# Patient Record
Sex: Male | Born: 1970 | Race: Black or African American | Hispanic: No | Marital: Single | Smoking: Never smoker
Health system: Southern US, Community
[De-identification: ages and names within clinical notes are randomized; demographics above are authoritative.]

## PROBLEM LIST (undated history)

## (undated) HISTORY — PX: BACK SURGERY: SHX140

## (undated) HISTORY — PX: HERNIA REPAIR: SHX51

## (undated) HISTORY — PX: KNEE SURGERY: SHX244

---

## 2016-06-14 ENCOUNTER — Emergency Department
Admission: EM | Admit: 2016-06-14 | Discharge: 2016-06-14 | Disposition: A | Discharge status: Home / Self Care | Payer: Self-pay | Attending: Emergency Medicine | Admitting: Emergency Medicine

## 2016-06-14 ENCOUNTER — Emergency Department: Payer: Self-pay

## 2016-06-14 ENCOUNTER — Encounter: Payer: Self-pay | Admitting: *Deleted

## 2016-06-14 DIAGNOSIS — R079 Chest pain, unspecified: Secondary | ICD-10-CM

## 2016-06-14 DIAGNOSIS — J069 Acute upper respiratory infection, unspecified: Secondary | ICD-10-CM | POA: Insufficient documentation

## 2016-06-14 MED ORDER — IBUPROFEN 800 MG PO TABS
800.0000 mg | ORAL_TABLET | Freq: Once | ORAL | Status: AC
  Administered 2016-06-14: 800 mg via ORAL

## 2016-06-14 MED ORDER — IBUPROFEN 800 MG PO TABS
ORAL_TABLET | ORAL | Status: AC
  Filled 2016-06-14: qty 1

## 2016-06-14 MED ORDER — BENZONATATE 100 MG PO CAPS: 100.0000 mg | ORAL_CAPSULE | Freq: Four times a day (QID) | ORAL | 0 refills | Status: AC | PRN

## 2016-06-14 MED ORDER — ALBUTEROL SULFATE HFA 108 (90 BASE) MCG/ACT IN AERS: 2.0000 | INHALATION_SPRAY | Freq: Four times a day (QID) | RESPIRATORY_TRACT | 0 refills | Status: AC | PRN

## 2016-06-14 MED ORDER — ALBUTEROL SULFATE (2.5 MG/3ML) 0.083% IN NEBU
5.0000 mg | INHALATION_SOLUTION | Freq: Once | RESPIRATORY_TRACT | Status: AC
  Administered 2016-06-14: 5 mg via RESPIRATORY_TRACT
  Filled 2016-06-14: qty 6

## 2016-06-14 NOTE — ED Notes (Signed)
Pt c/o chest pain and tightness with shortness of breath over the last 2 days - reports productive cough - runny nose - fever reported (pt never took temp)

## 2016-06-14 NOTE — ED Provider Notes (Signed)
[facility] Emergency Department Provider Note  ____________________________________________  Time seen: Approximately 10:21 PM  I have reviewed the triage vital signs and the nursing notes.   HISTORY  Chief Complaint Cough    HPI Timothy Henderson is a 46 y.o. male , nonsmoker, presenting with cough, congestion and rhinorrhea, sore throat and chest pain. The patient reports that over the past several days he has been having a significant nonproductive cough. Since then, he has developed a mild sore throat and congestion and rhinorrhea without ear pain, shortness of breath, or drooling. No body aches, nausea vomiting or diarrhea. With the coughing, he develops a central chest pain that resolves when he stops coughing.  No pleuritic component, LE swelling or calf pain.   No past medical history on file.  There are no active problems to display for this patient.   Past Surgical History:  Procedure Laterality Date  . BACK SURGERY    . HERNIA REPAIR    . KNEE SURGERY Left       Allergies Penicillins  No family history on file.  Social History Social History  Substance Use Topics  . Smoking status: Never Smoker  . Smokeless tobacco: Never Used  . Alcohol use No    Review of Systems Constitutional: No fever/chills.No lightheadedness or syncope. No myalgias. Eyes: No visual changes. No eye discharge. ENT: Positive mild sore throat. Positive congestion and rhinorrhea. Cardiovascular: Positive chest pain with coughing only. Denies palpitations. Respiratory: Denies shortness of breath.  Positive nonproductive cough. Gastrointestinal: No abdominal pain.  No nausea, no vomiting.  No diarrhea.  No constipation. Genitourinary: Negative for dysuria. Musculoskeletal: Negative for back pain. No lower extremity swelling or calf pain. Skin: Negative for rash. Neurological: Negative for headaches. No focal numbness, tingling or weakness.   10-point ROS  otherwise negative.  ____________________________________________   PHYSICAL EXAM:  VITAL SIGNS: ED Triage Vitals  Enc Vitals Group     BP 06/14/16 2203 122/86     Pulse Rate 06/14/16 2203 96     Resp 06/14/16 2203 18     Temp 06/14/16 2203 98.9 F (37.2 C)     Temp Source 06/14/16 2203 Oral     SpO2 06/14/16 2203 95 %     Weight 06/14/16 2204 235 lb (106.6 kg)     Height 06/14/16 2204 6\' 1"  (1.854 m)     Head Circumference --      Peak Flow --      Pain Score 06/14/16 2204 6     Pain Loc --      Pain Edu? --      Excl. in GC? --     Constitutional: Alert and oriented. Well appearing and in no acute distress. Answers questions appropriately. Eyes: Conjunctivae are normal.  EOMI. No scleral icterus. Head: Atraumatic. Nose: Positive congestion/rhinnorhea. Mouth/Throat: Mucous membranes are moist. No posterior pharyngeal erythema, tonsillar swelling or exudate. Posterior palate is symmetric and the uvula is midline. Neck: No stridor.  Supple.  No JVD. No meningismus. Cardiovascular: Normal rate, regular rhythm. No murmurs, rubs or gallops.  Respiratory: Normal respiratory effort.  No accessory muscle use or retractions. Lungs CTAB.  No wheezes, rales or ronchi. Gastrointestinal: Soft, nontender and nondistended.  No guarding or rebound.  No peritoneal signs. Musculoskeletal: No LE edema. No ttp in the calves or palpable cords.  Negative Homan's sign. Neurologic:  A&Ox3.  Speech is clear.  Face and smile are symmetric.  EOMI.  Moves all extremities well. Skin:  Skin is warm, dry and intact. No rash noted. Psychiatric: Mood and affect are normal. Speech and behavior are normal.  Normal judgement.  ____________________________________________   LABS (all labs ordered are listed, but only abnormal results are displayed)  Labs Reviewed - No data to display ____________________________________________  EKG  ED ECG REPORT I, Rockne MenghiniNorman, Anne-Caroline, the attending physician,  personally viewed and interpreted this ECG.   Date: 06/14/2016  EKG Time: 2201  Rate: 88  Rhythm: normal sinus rhythm  Axis: leftward  Intervals:none  ST&T Change: No STEMI  ____________________________________________  RADIOLOGY  No results found.  ____________________________________________   PROCEDURES  Procedure(s) performed: None  Procedures  Critical Care performed: No ____________________________________________   INITIAL IMPRESSION / ASSESSMENT AND PLAN / ED COURSE  Pertinent labs & imaging results that were available during my care of the patient were reviewed by me and considered in my medical decision making (see chart for details).  46 y.o. male with several days of cough, congestion and rhinorrhea. Overall, the patient is well-appearing and has stable vital signs. His cardiopulmonary examination is also normal. He likely has a viral URI, and chest x-rays pending to evaluate for pneumonia. His chest pain is only related to his coughing and is likely due to strain from persistent cough. I will plan to discharge him home with Tessalon Perles, and albuterol MDI, and close PMD follow-up. Return precautions as well as follow-up instructions were discussed.  ____________________________________________  FINAL CLINICAL IMPRESSION(S) / ED DIAGNOSES  Final diagnoses:  Upper respiratory tract infection, unspecified type  Chest pain, unspecified type         NEW MEDICATIONS STARTED DURING THIS VISIT:  New Prescriptions   ALBUTEROL (PROVENTIL HFA;VENTOLIN HFA) 108 (90 BASE) MCG/ACT INHALER    Inhale 2 puffs into the lungs every 6 (six) hours as needed for wheezing or shortness of breath.   BENZONATATE (TESSALON PERLES) 100 MG CAPSULE    Take 1 capsule (100 mg total) by mouth every 6 (six) hours as needed for cough.      Rockne MenghiniAnne-Caroline Amous Crewe, MD 06/14/16 2238

## 2016-06-14 NOTE — Discharge Instructions (Signed)
Please drink plenty of fluid to stay well-hydrated. You may take albuterol and Tessalon Perles to decrease cough. You may take Tylenol or Motrin for ear pain.  Return to the emergency department if you develop shortness of breath, lightheadedness or fainting, chest pain, fever, or any other symptoms concerning to you.

## 2016-06-14 NOTE — ED Triage Notes (Signed)
Pt c/o cough and flu-like sxs x 2 days. Pt c/o chest pain, worse w/ cough. Pt c/o coughing phlegm w/ streaks of blood. Pt c/o headache and orthopnea. Pt ambulatory to triage, drove self to ED, no observable acute distress at this time.

## 2016-06-14 NOTE — ED Notes (Signed)
Pt in with co cold symptoms for few days.

## 2016-06-15 ENCOUNTER — Telehealth: Payer: Self-pay | Admitting: Emergency Medicine

## 2016-06-15 NOTE — Telephone Encounter (Signed)
Calling requesting a work note , spoke with Dr.Williams , pt return to work 06-16-16, no restrictions.   Work Note Faxed to Attn: ChiropodistAkela Neumaier

## 2017-10-03 IMAGING — CR DG CHEST 2V
1 series · 2 of 2 positions shown · non-contrast
Comparison: None.

CLINICAL DATA: Chest pain

EXAM:
CHEST  2 VIEW

[Series 1: dg chest 2 view · 0.14mm/px · 2 of 2 slices shown]
[im 1/2]
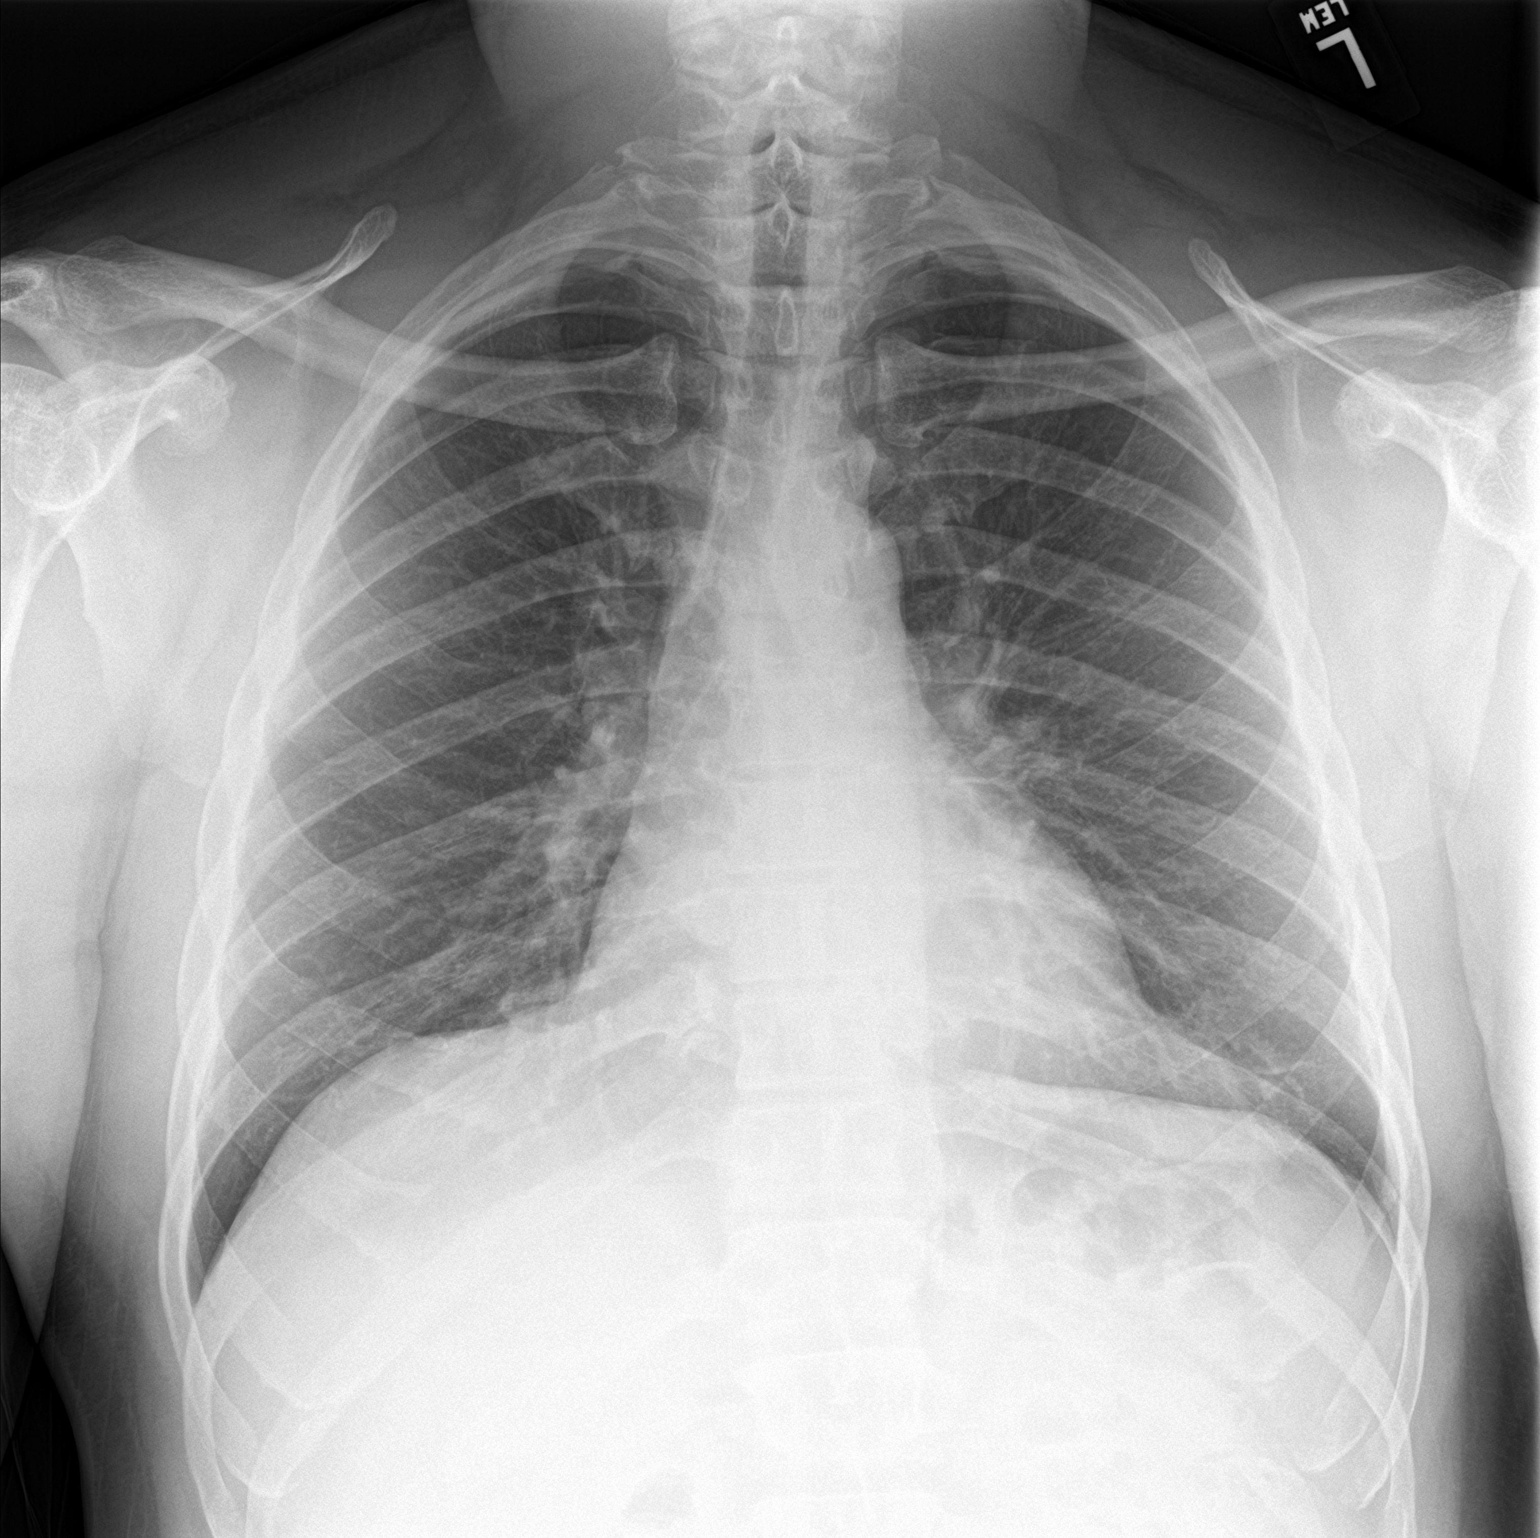
[im 2/2]
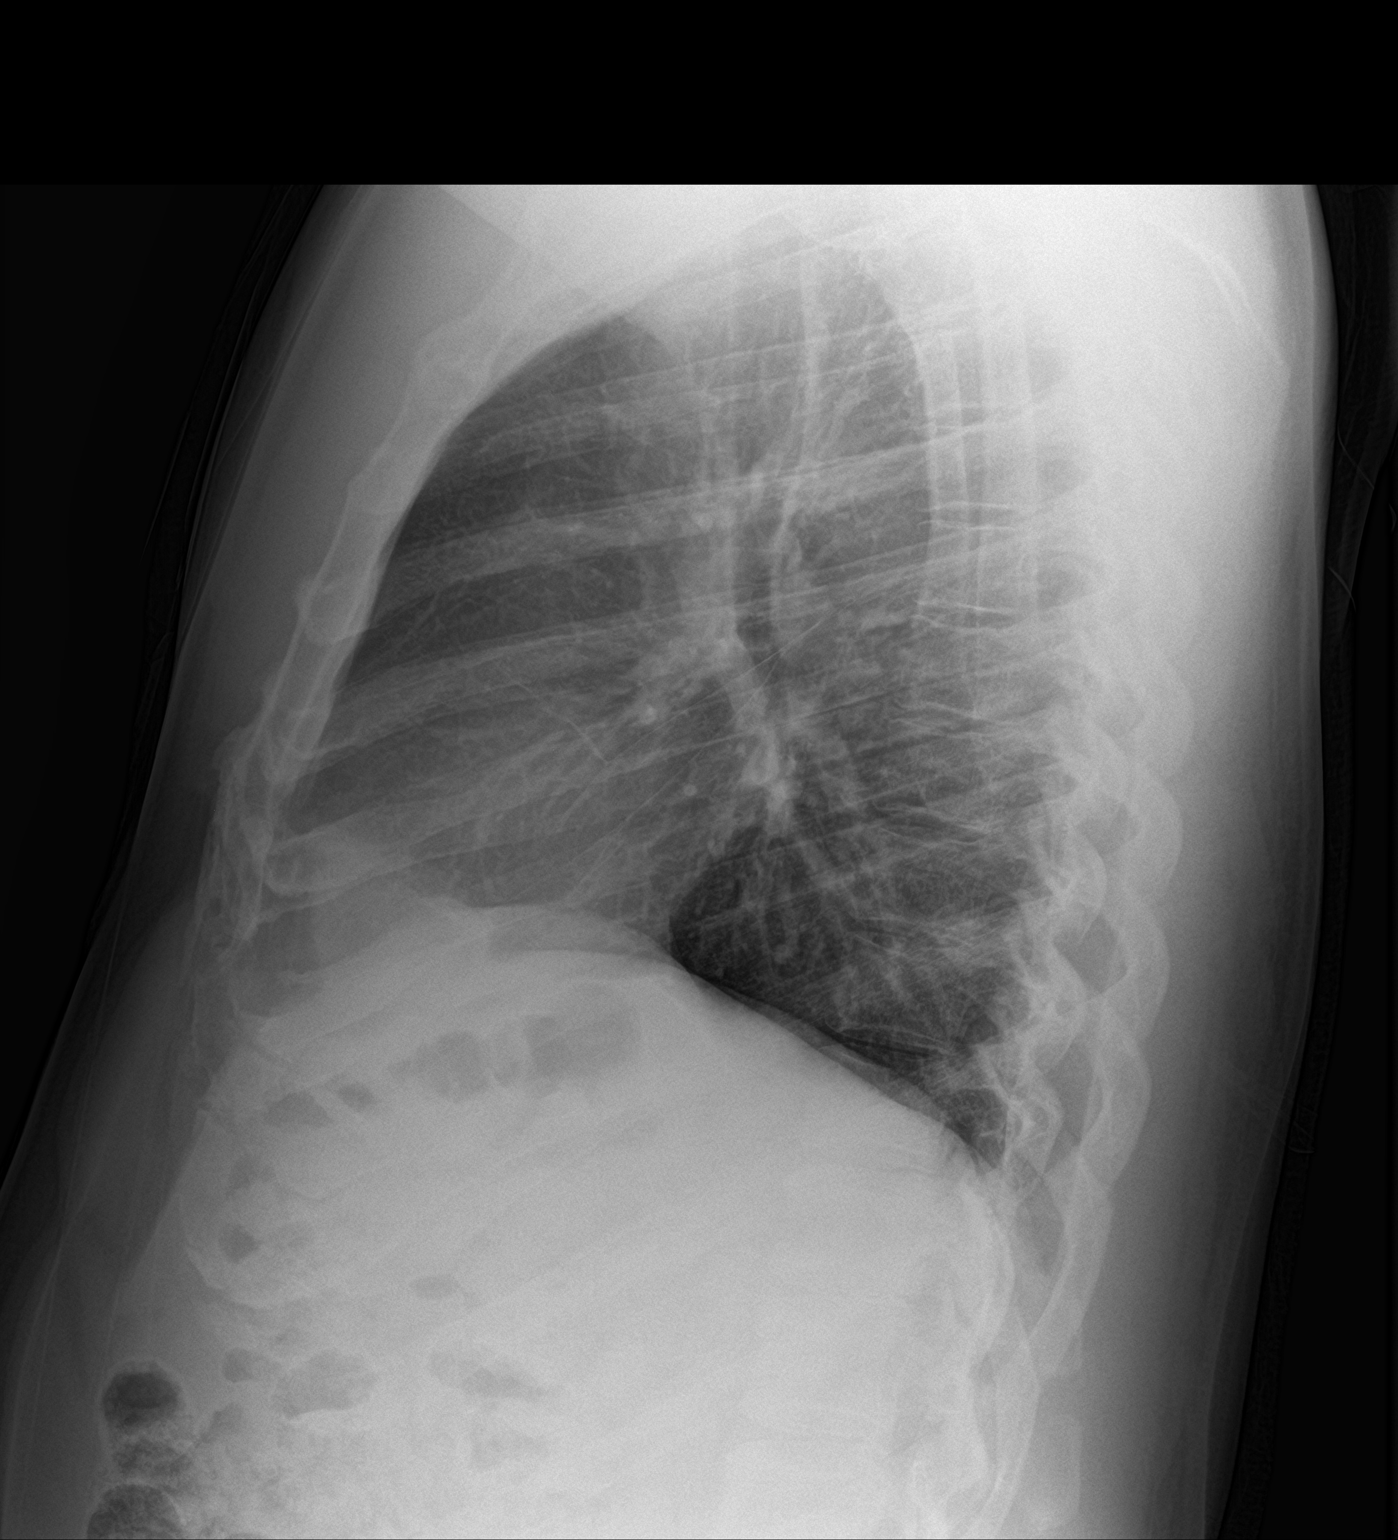

[2 of 2 positions shown; findings below may reference images not displayed]

FINDINGS: The heart size and mediastinal contours are within normal limits.
Both lungs are clear. The visualized skeletal structures are
unremarkable.
IMPRESSION: No active cardiopulmonary disease.
